# Patient Record
Sex: Male | Born: 1985 | Race: White | Hispanic: Yes | Marital: Single
Health system: Southern US, Community
[De-identification: ages and names within clinical notes are randomized; demographics above are authoritative.]

---

## 2006-07-09 ENCOUNTER — Ambulatory Visit: Payer: Self-pay | Admitting: Family Medicine

## 2007-12-16 ENCOUNTER — Ambulatory Visit: Payer: Self-pay | Admitting: Internal Medicine

## 2007-12-16 ENCOUNTER — Ambulatory Visit (HOSPITAL_COMMUNITY): Admission: RE | Admit: 2007-12-16 | Discharge: 2007-12-16 | Payer: Self-pay | Admitting: Internal Medicine

## 2007-12-16 DIAGNOSIS — M25569 Pain in unspecified knee: Secondary | ICD-10-CM | POA: Insufficient documentation

## 2007-12-20 ENCOUNTER — Ambulatory Visit: Payer: Self-pay | Admitting: *Deleted

## 2008-01-13 ENCOUNTER — Ambulatory Visit: Payer: Self-pay | Admitting: Internal Medicine

## 2008-01-13 DIAGNOSIS — M79609 Pain in unspecified limb: Secondary | ICD-10-CM

## 2010-10-05 ENCOUNTER — Encounter: Payer: Self-pay | Admitting: Internal Medicine

## 2013-12-01 ENCOUNTER — Encounter (HOSPITAL_COMMUNITY): Payer: Self-pay | Admitting: Emergency Medicine

## 2013-12-01 ENCOUNTER — Emergency Department (HOSPITAL_COMMUNITY): Payer: Self-pay

## 2013-12-01 ENCOUNTER — Emergency Department (HOSPITAL_COMMUNITY)
Admission: EM | Admit: 2013-12-01 | Discharge: 2013-12-01 | Disposition: A | Discharge status: Home / Self Care | Payer: Self-pay | Attending: Emergency Medicine | Admitting: Emergency Medicine

## 2013-12-01 DIAGNOSIS — G8918 Other acute postprocedural pain: Secondary | ICD-10-CM | POA: Insufficient documentation

## 2013-12-01 DIAGNOSIS — R6884 Jaw pain: Secondary | ICD-10-CM | POA: Insufficient documentation

## 2013-12-01 DIAGNOSIS — T819XXA Unspecified complication of procedure, initial encounter: Secondary | ICD-10-CM

## 2013-12-01 DIAGNOSIS — M542 Cervicalgia: Secondary | ICD-10-CM | POA: Insufficient documentation

## 2013-12-01 LAB — CBC WITH DIFFERENTIAL/PLATELET
BASOS ABS: 0 10*3/uL (ref 0.0–0.1)
Basophils Relative: 0 % (ref 0–1)
EOS ABS: 0.7 10*3/uL (ref 0.0–0.7)
EOS PCT: 7 % — AB (ref 0–5)
HCT: 46.3 % (ref 39.0–52.0)
Hemoglobin: 16.9 g/dL (ref 13.0–17.0)
LYMPHS PCT: 28 % (ref 12–46)
Lymphs Abs: 3 10*3/uL (ref 0.7–4.0)
MCH: 32.6 pg (ref 26.0–34.0)
MCHC: 36.5 g/dL — ABNORMAL HIGH (ref 30.0–36.0)
MCV: 89.4 fL (ref 78.0–100.0)
Monocytes Absolute: 0.9 10*3/uL (ref 0.1–1.0)
Monocytes Relative: 8 % (ref 3–12)
NEUTROS PCT: 57 % (ref 43–77)
Neutro Abs: 6 10*3/uL (ref 1.7–7.7)
PLATELETS: 280 10*3/uL (ref 150–400)
RBC: 5.18 MIL/uL (ref 4.22–5.81)
RDW: 12.2 % (ref 11.5–15.5)
WBC: 10.6 10*3/uL — AB (ref 4.0–10.5)

## 2013-12-01 LAB — BASIC METABOLIC PANEL
BUN: 16 mg/dL (ref 6–23)
CALCIUM: 10.4 mg/dL (ref 8.4–10.5)
CO2: 25 mEq/L (ref 19–32)
Chloride: 101 mEq/L (ref 96–112)
Creatinine, Ser: 0.7 mg/dL (ref 0.50–1.35)
GFR calc Af Amer: >90 mL/min (ref >90)
Glucose, Bld: 112 mg/dL — ABNORMAL HIGH (ref 70–99)
POTASSIUM: 4.5 meq/L (ref 3.7–5.3)
SODIUM: 140 meq/L (ref 137–147)

## 2013-12-01 MED ORDER — IOHEXOL 300 MG/ML  SOLN
75.0000 mL | Freq: Once | INTRAMUSCULAR | Status: AC | PRN
  Administered 2013-12-01: 75 mL via INTRAVENOUS

## 2013-12-01 MED ORDER — HYDROCODONE-ACETAMINOPHEN 5-325 MG PO TABS: 1.0000 | ORAL_TABLET | ORAL | Status: AC | PRN

## 2013-12-01 NOTE — ED Provider Notes (Signed)
CSN: [id number]     Arrival date & time 12/01/13  1234 History  This chart was scribed for non-physician practitioner Dorthula Matas, PA-C working with Glynn Octave, MD by Leone Payor, ED Scribe. This patient was seen in room TR08C/TR08C and the patient's care was started at 1:31 PM.    Chief Complaint  Patient presents with  . Sore Throat      The history is provided by the patient. A language interpreter was used.    HPI Comments: PHENG PROKOP is a 28 y.o. male who presents to the Emergency Department complaining of 1 week of gradual onset, constant, gradually worsening left throat pain that radiates down the left neck and up to the jaw. Pt was seen by a dentist and had a tooth extracted on 11/20/13 in [place]. He was seen in a clinic where he was given an injection and prescribed naprosyn, ibuprofen, and ampicillin. He has taken this medication without significant relief. He states the pain is persistent and is now radiating to the left arm. He denies cough, fever, nausea, vomiting, nasal discharge.   History reviewed. No pertinent past medical history. History reviewed. No pertinent past surgical history. History reviewed. No pertinent family history. History  Substance Use Topics  . Smoking status: Not on file  . Smokeless tobacco: Not on file  . Alcohol Use: Not on file    Review of Systems  Constitutional: Negative for fever.  HENT: Negative for facial swelling, postnasal drip and rhinorrhea.        Left jaw pain   Respiratory: Negative for cough.   Gastrointestinal: Negative for nausea and vomiting.  Musculoskeletal: Positive for neck pain (left ).      Allergies  Review of patient's allergies indicates not on file.  Home Medications   Current Outpatient Rx  Name  Route  Sig  Dispense  Refill  . HYDROcodone-acetaminophen (NORCO/VICODIN) 5-325 MG per tablet   Oral   Take 1-2 tablets by mouth every 4 (four) hours as needed.   20 tablet   0    BP 128/71   Pulse 90  Temp(Src) 98.8 F (37.1 C) (Oral)  Resp 20  Wt 160 lb (72.576 kg)  SpO2 100% Physical Exam  Nursing note and vitals reviewed. Constitutional: He is oriented to person, place, and time. He appears well-developed and well-nourished.  HENT:  Head: Normocephalic and atraumatic.  Nose: Nose normal.  Mouth/Throat: Uvula is midline, oropharynx is clear and moist and mucous membranes are normal.  No obvious signs of abscess or infection to area of tooth extraction.   Neck: No spinous process tenderness and no muscular tenderness present. Normal range of motion present.    Cardiovascular: Normal rate.   Pulmonary/Chest: Effort normal.  Abdominal: He exhibits no distension.  Neurological: He is alert and oriented to person, place, and time.  Skin: Skin is warm and dry.  Psychiatric: He has a normal mood and affect.    ED Course  Procedures (including critical care time)  DIAGNOSTIC STUDIES: Oxygen Saturation is 96% on RA, adequate by my interpretation.    COORDINATION OF CARE: 1:33 PM Will order CT of the neck along with CBC, BMP.  Discussed treatment plan with pt at bedside and pt agreed to plan.  3:24 PM Discussed negative CT scan with patient. CT scan is reassuring, no sign of abscess or abnormal mass or swelling. The lymph nodes are enlarged but it does not appear to be anything concerning. Given referral to provider in  the area and is to return to ER if his symptoms return or worsen.      Labs Review Labs Reviewed  CBC WITH DIFFERENTIAL - Abnormal; Notable for the following:    WBC 10.6 (*)    MCHC 36.5 (*)    Eosinophils Relative 7 (*)    All other components within normal limits  BASIC METABOLIC PANEL - Abnormal; Notable for the following:    Glucose, Bld 112 (*)    All other components within normal limits   Imaging Review Ct Soft Tissue Neck W Contrast  12/01/2013   CLINICAL DATA:  Left-sided neck pain.  Tooth extraction 11/20/2013  EXAM: CT NECK WITH  CONTRAST  TECHNIQUE: Multidetector CT imaging of the neck was performed using the standard protocol following the bolus administration of intravenous contrast.  CONTRAST:  75mL OMNIPAQUE IOHEXOL 300 MG/ML  SOLN  COMPARISON:  None.  FINDINGS: Recent extraction left lower third molar without evidence of surrounding inflammation or abscess. Impacted right lower third molar.  Mucosal edema in the maxillary sinus bilaterally. No air-fluid level.  The tongue and tonsils are normal and symmetric. No evidence of tonsillitis or peritonsillar abscess.  Parotid and submandibular glands are normal. Thyroid is normal. Airway is normal. Epiglottis and larynx are normal.  Right level 2B lymph node measures 7 mm. Left level 2B node measures 10 mm. No pathologic adenopathy in the neck. Lung apices are clear. Cervical spine is normal.  IMPRESSION: Left lower third molar recent extraction. No evidence of surrounding edema or abscess.  Mild chronic sinusitis.  No mass or adenopathy in the neck.  No evidence of peritonsillar abscess.   Electronically Signed   By: Marlan Palauharles  Clark M.D.   On: 12/01/2013 14:59     EKG Interpretation None      MDM   Final diagnoses:  Post-operative complication   27 y.o.Corena HerterJuan J Shaikh's evaluation in the Emergency Department is complete. It has been determined that no acute conditions requiring further emergency intervention are present at this time. The patient/guardian have been advised of the diagnosis and plan. We have discussed signs and symptoms that warrant return to the ED, such as changes or worsening in symptoms.  Vital signs are stable at discharge. Filed Vitals:   12/01/13 1526  BP: 128/71  Pulse: 90  Temp: 98.8 F (37.1 C)  Resp: 20    Patient/guardian has voiced understanding and agreed to follow-up with the PCP or specialist.  I personally performed the services described in this documentation, which was scribed in my presence. The recorded information has been  reviewed and is accurate.    Dorthula Matasiffany G Christohper Dube, PA-C 12/01/13 1652

## 2013-12-01 NOTE — Discharge Instructions (Signed)
Extracción De Un Diente  (Dental Extraction)  Una extracción dental es la extracción de rutina de un diente, realizada por el dentista. El procedimiento depende del lugar y el modo en que se encuentre ubicado el diente. Puede ser un procedimiento muy rápido, en algunos casos sólo dura unos segundos. Los motivos para una extracción dental son:   · Caries dental.  · Infecciones (abscesos).  · Necesidad de hacer lugar para otro diente.  · Enfermedades de las encías en las que el hueso que sostiene el diente se ha destruido.  · Fracturas en el diente que lo hacen irrecuperable.  · Dientes extra (supernumerarios) o muy deformados.  · Dientes de leche que no se han caído en su momento y no han permitido que salgan los dientes permanentes.  · Como preparación para la ortodoncia cuando no hay lugar suficiente para alinear correctamente los dientes.  · Cuando no hay lugar suficiente para las muelas del juicio (especialmente cuando todavía no han salido).  · Antes de recibir radiación en la cabeza y el cuello, los dientes que se encuentran el el campo de la radiación deben extraerse.  HÁGALE SABER A SU MÉDICO:   · Si tiene alergias.  · Todos los medicamentos que toma.  · Incluya las hierbas, las gotas para los ojos, los medicamentos de venta libre y las cremas.  · Los anticoagulantes, la aspirina y otros medicamentos pueden afectar la coagulación de la sangre.  · Uso de corticoides (por vía oral o cremas).  · Problemas anteriores debido a anestésicos incluyendo anestesias locales.  · Historia o problemas de hemorragias.  · Cirugías anteriores.  · Posibilidad de embarazo, si corresponde.  · Si es o ha sido fumador.  · Otros problemas de salud.  RIESGOS Y COMPLICACIONES  Como en cualquier procedimiento, puede haber complicaciones, pero generalmente el profesional puede controlarlas. Las complicaciones generales de la cirugía pueden ser:   · Reacciones a la anestesia.  · Lesiones en las estructuras,dientes,nervios, tejidos que  rodean la zona de la extracción.  · Infecciones  · Hemorragias.   Con un tratamiento y cuidados adecuados luego de la cirugía, las siguientes complicaciones son muy infrecuentes:  · Alveolitis seca (el coágulo de sangre no se forma o se queda en el lugar sobre la cavidad vacía). Esto puede demorar la curación.  · Extracción incompleta de la raíz.  · Lesión,dolor o debilidad en el hueso de la mandíbula.  ANTES DEL PROCEDIMIENTO   ·  El odontólogo podrá:  · Realizar una historia clínica y dental.  · Tomar radiografías para evaluar las circunstancias y el mejor modo para extraer el diente.  · Hacer un examen de la boca.  · Según el caso. podrá indicarle antibióticos antes o después de la extracción.  · El médico podrá revisar el procedimiento, la anestesia local o sedación a utilizar y que podrá esperarse después.  · Si es necesario, el dentista le dará un formulario para informar sobre la sedación, ya sea como medicamento para ingerir, gas o por vía intravenosa. Esto ayudará a calmar la ansiedad. Las extracciones complicadas pueden requerir el uso de anestesia general.   Es importante que siga las indicaciones que el médico le dará antes del procedimiento para evitar complicaciones. Los pasos a seguir antes del procedimiento son:  · Informe a su médico si se siente enfermo (tiene dolor de garganta, fiebre, malestar en el estómago) en los días anteriores a la extracción.  · Durante algunos días antes del procedimiento deje de usar cierto tipo de medicamentos,   como los anticoagulantes.  · Debe tomar otros medicamentos, como antibióticos.  · No coma ni beba durante las horas previas. Esto lo ayudará a evitar complicaciones por la sedación o la anestesia.  · Firme el formulario de consentimiento informado.  · Pídale a un amigo o familiar que lo lleve hasta el consultorio del dentista y lo traiga de vuelta a su casa después de la extracción.  · Use ropas cómodas y sueltas. No use maquillaje ni bijouterie.  · Deje de fumar.  Si es fumador, tendrá más probabilidades de tener problemas para curarse bien. Si piensa dejar de fumar,hable con el cirujano para que le aconseje cuánto tiempo antes de la operación debe dejar de fumar. También puede pedir ayuda a su médico de confianza.  PROCEDIMIENTO   La extracción dental generalmente se realiza en los consultorios externos. Podrán utilizarse sedación por vía intravenosa, anestesia local, o ambas. Esto permite que se sienta cómodo y sin dolor durante el procedimiento.   Hay dos tipos de extracciones:   · Extracción simple que trabaja sobre un diente visible y por arriba de la línea de la encía. Luego de aplicar un anestésico inyectable, y que se haya adormecido la zona, el dentista aflojará el diente con un instrumento especial (elevador). Luego utilizará otro instrumento (fórceps) para sujetar el diente y sacarlo de su cavidad. Durante el procedimiento sentirá cierta presión, pero no debe sentir dolor. Si siente dolor, infórmelo al dentista. Se limpiará la cavidad abierta. Luego le colocará una gasa para disminuir el sangrado.  · Las extracciones quirúrgicas se hacen cuando el diente no ha salido de la encía, o se ha roto por debajo de la línea de la encía. El dentista hará un corte (incisión) en la encía y podrá extraer una parte del hueso que rodea al diente para ayudar a extraerlo. Luego de la extracción, podrá colocar grapas (suturas) para cerrar la zona, ayudar a la curación y controlar el sangrado. En algunas extracciones quirúrgicas, podrá ser necesario aplicar anestesia general o una sedación intravenosa (por la vena).  Después de toda extracción, le indicarán medicamentos para calmar el dolor y otros medicamentos para ayudar a la curación. El dentista le dará otras instrucciones.   DESPUÉS DEL PROCEDIMIENTO   · Tendrá una gasa en la boca, en el lugar en que le extrajeron el diente. Presione la gasa suavemente durante 1 hora para controlar el sangrado.  · Sobre la cavidad abierta  comenzará a formarse un coágulo de sangre. Esto es normal. No toque la zona ni la enjuague.  · Podrá controlar el dolor con medicamentos y cuidados.  · Le darán instrucciones para los cuidados luego de la cirugía.  PRONÓSTICO   Es normal sentir algunas molestias después de la extracción de un diente, la mayoría de los pacientes se recuperan en pocos días.   SOLICITE ATENCIÓN MÉDICA DE INMEDIATO SI:  · Presenta una hemorragia que no puede controlar, observa una hinchazón importante o siente dolor intenso.  · Le sube la fiebre, tiene dificultad para tragar o presenta otros síntomas de gravedad.  · Tiene preguntas o preocupaciones.  Document Released: 08/31/2005 Document Revised: 11/23/2011  [employer]® Patient Information ©2014 [employer].

## 2013-12-01 NOTE — ED Provider Notes (Signed)
Medical screening examination/treatment/procedure(s) were performed by non-physician practitioner and as supervising physician I was immediately available for consultation/collaboration.   EKG Interpretation None       Deren Degrazia, MD 12/01/13 1826 

## 2013-12-01 NOTE — ED Notes (Signed)
Pt reports sore throat x 1 month, painful to speak or swallow.

## 2013-12-01 NOTE — ED Notes (Signed)
Pt speaks limited English. C/o sore throat x 1 month. Denies fever or chills. Denies difficulty swallowing.

## 2014-11-29 IMAGING — CT CT NECK W/ CM
4 of 5 series · 16 of 33 positions shown, 18 images · IV contrast (CONTRAST)
Comparison: None.

CLINICAL DATA: Left-sided neck pain.  Tooth extraction 11/20/2013

EXAM:
CT NECK WITH CONTRAST
TECHNIQUE: Multidetector CT imaging of the neck was performed using the
standard protocol following the bolus administration of intravenous
contrast.
CONTRAST:  75mL OMNIPAQUE IOHEXOL 300 MG/ML  SOLN

[Series 2: soft tissue · axial · 0.49mm/px · z∈[+116,+260]mm · 4 of 122 slices shown]
[im 25/122  soft-tissue]
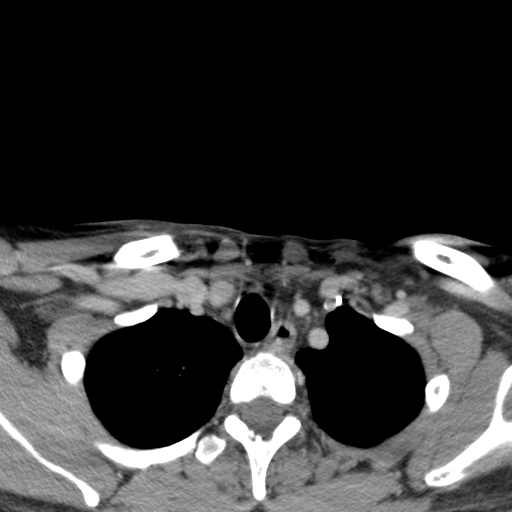
[im 49/122  soft-tissue]
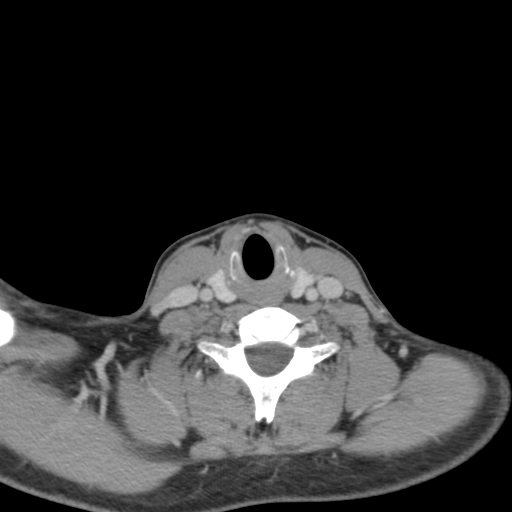
[im 73/122  soft-tissue]
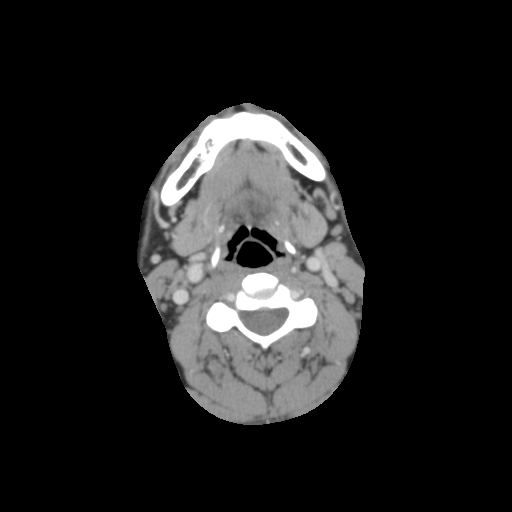
[im 97/122  soft-tissue]
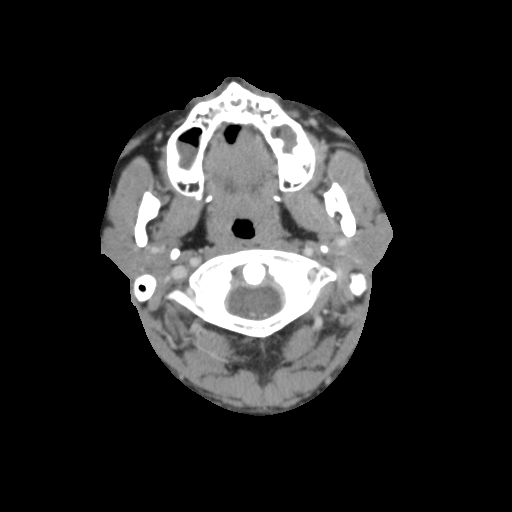

[orthog · axial · 0.49mm/px · z∈[+75,+212]mm · 4 of 120 slices shown, 5 images]
[im 24/120  soft-tissue]
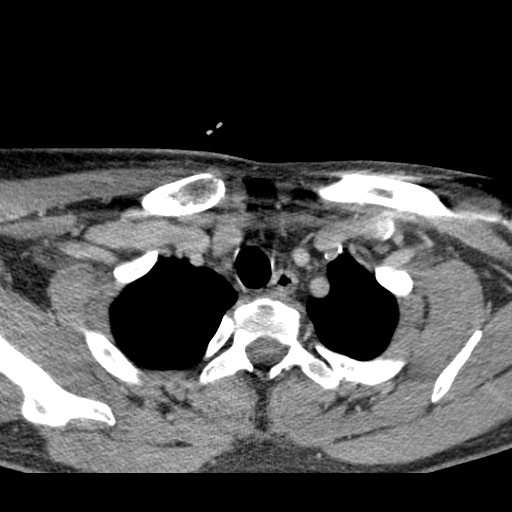
[im 24/120  bone]
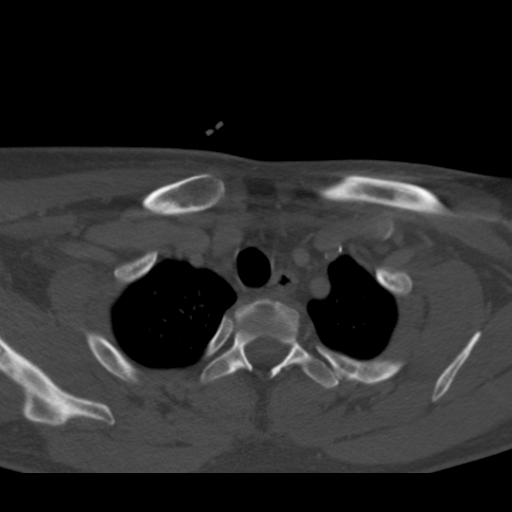
[im 48/120  bone]
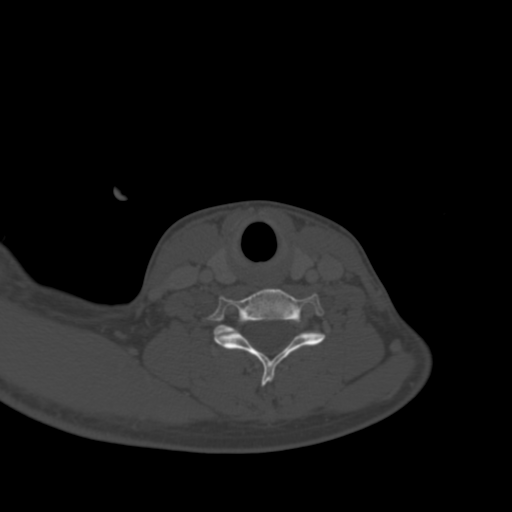
[im 72/120  bone]
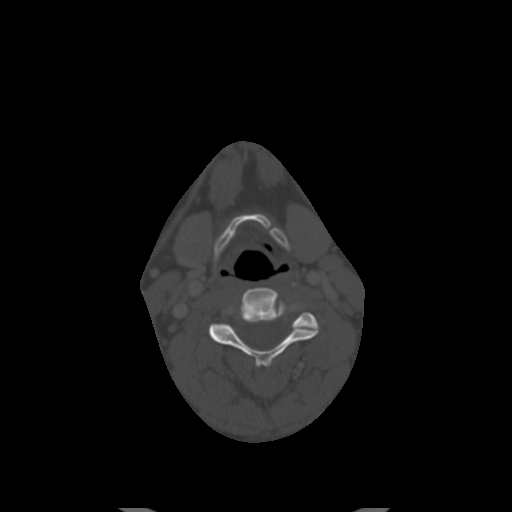
[im 96/120  bone]
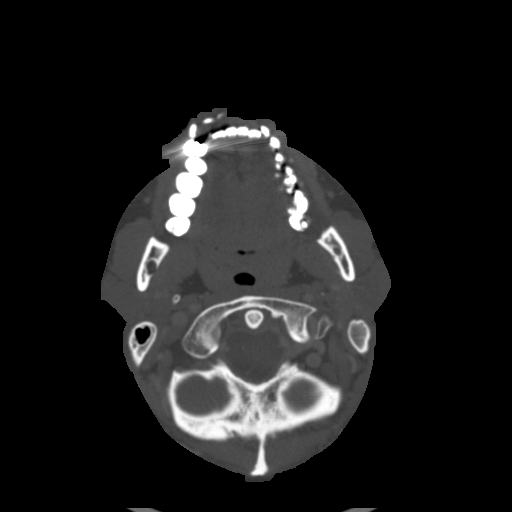

[cor · coronal · 0.49mm/px · 3 of 92 slices shown]
[im 19/92  bone]
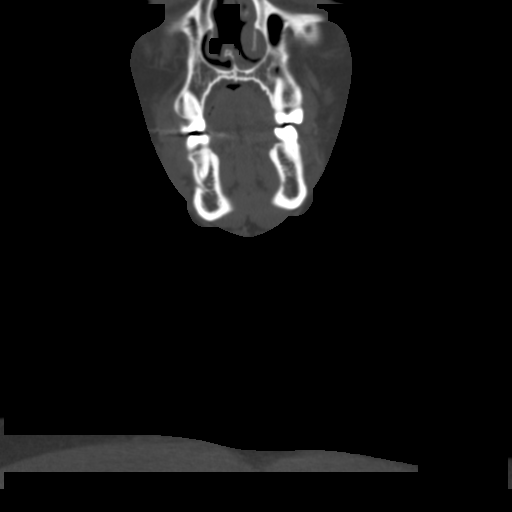
[im 37/92  bone]
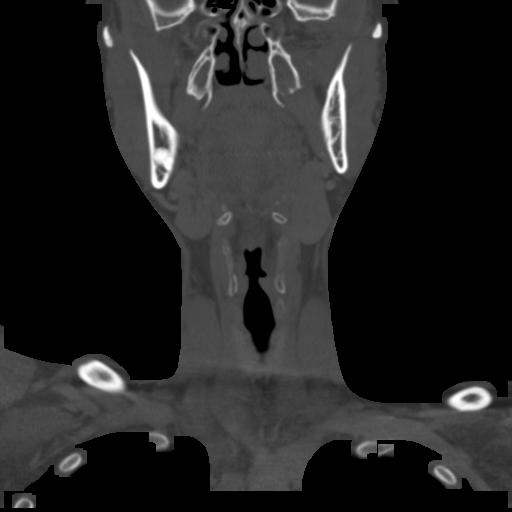
[im 55/92  bone]
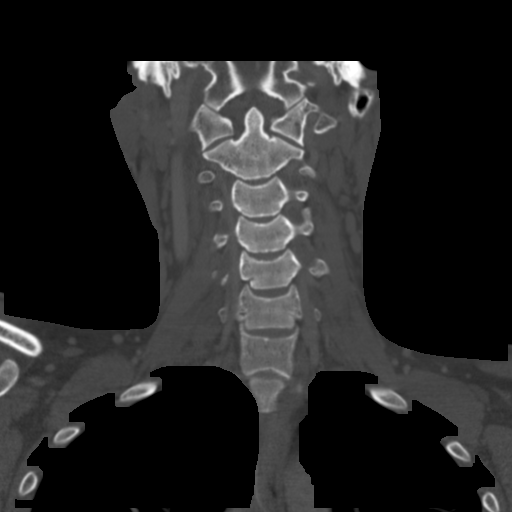

[sag · sagittal · 0.49mm/px · 5 of 88 slices shown, 6 images]
[im 30/88  bone]
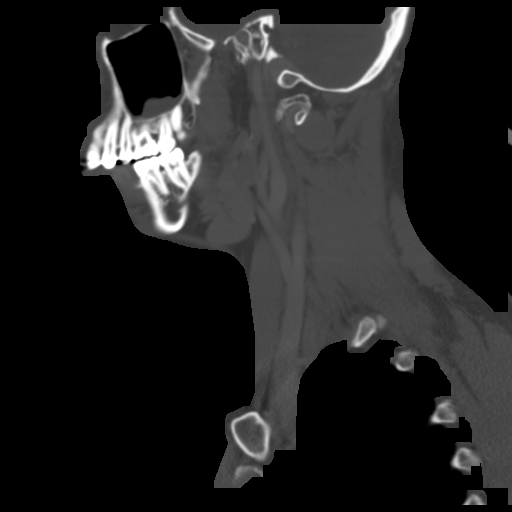
[im 37/88  bone]
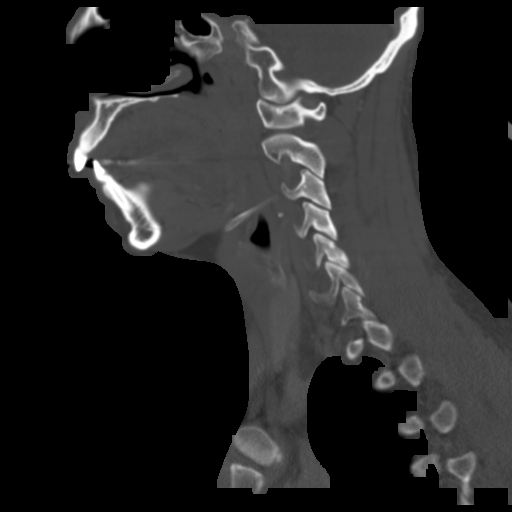
[im 44/88  soft-tissue]
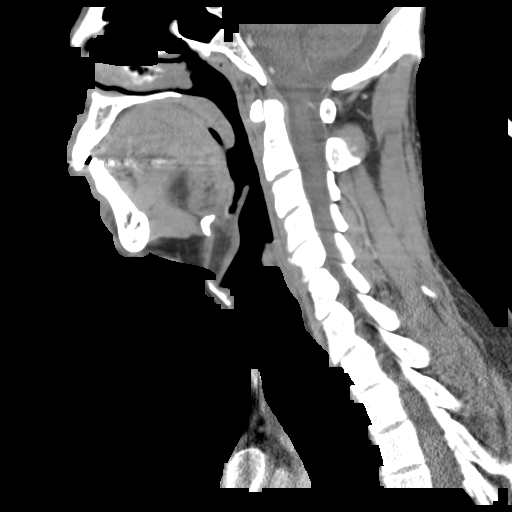
[im 44/88  bone]
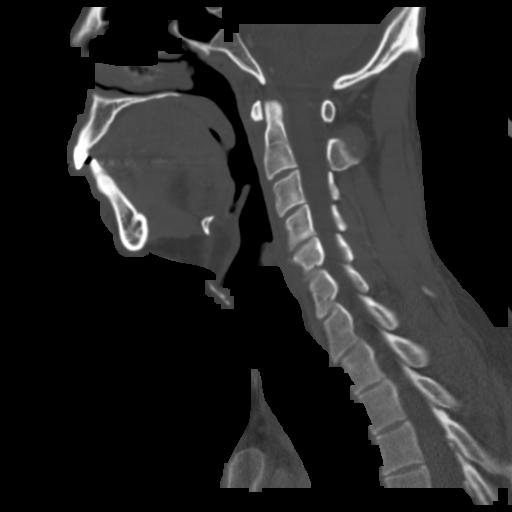
[im 51/88  bone]
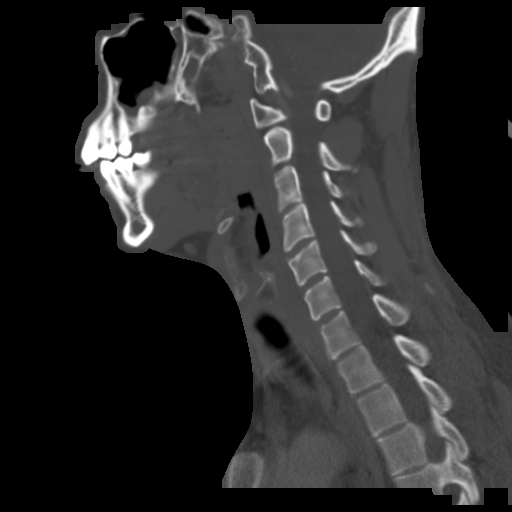
[im 59/88  bone]
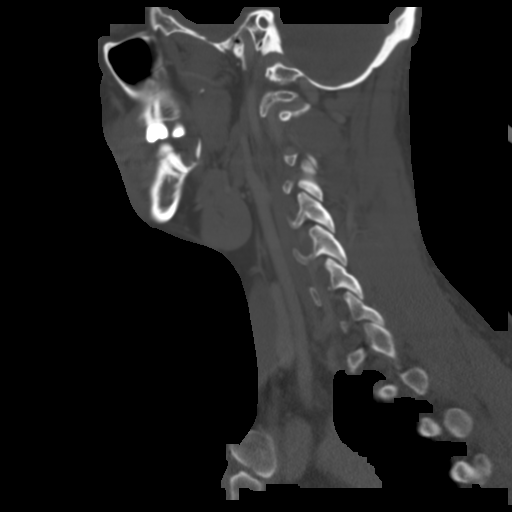

[16 of 33 positions shown; findings below may reference images not displayed]

FINDINGS: Recent extraction left lower third molar without evidence of
surrounding inflammation or abscess. Impacted right lower third
molar.

Mucosal edema in the maxillary sinus bilaterally. No air-fluid
level.

The tongue and tonsils are normal and symmetric. No evidence of
tonsillitis or peritonsillar abscess.

Parotid and submandibular glands are normal. Thyroid is normal.
Airway is normal. Epiglottis and larynx are normal.

Right level 2B lymph node measures 7 mm. Left level 2B node measures
10 mm. No pathologic adenopathy in the neck. Lung apices are clear.
Cervical spine is normal.
IMPRESSION: Left lower third molar recent extraction. No evidence of surrounding
edema or abscess.

Mild chronic sinusitis.

No mass or adenopathy in the neck.

No evidence of peritonsillar abscess.
